# Patient Record
Sex: Female | Born: 1985 | Race: Black or African American | Hispanic: No | Marital: Single | Smoking: Never smoker
Health system: Southern US, Community
[De-identification: ages and names within clinical notes are randomized; demographics above are authoritative.]

---
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
## 2003-02-21 ENCOUNTER — Emergency Department (HOSPITAL_COMMUNITY): Admission: EM | Admit: 2003-02-21 | Discharge: 2003-02-21 | Payer: Self-pay | Admitting: *Deleted

## 2004-03-30 ENCOUNTER — Ambulatory Visit: Payer: Self-pay | Admitting: Internal Medicine

## 2004-03-31 ENCOUNTER — Ambulatory Visit: Payer: Self-pay | Admitting: *Deleted

## 2004-05-26 ENCOUNTER — Ambulatory Visit: Payer: Self-pay | Admitting: Internal Medicine

## 2004-08-26 ENCOUNTER — Encounter (INDEPENDENT_AMBULATORY_CARE_PROVIDER_SITE_OTHER): Payer: Self-pay | Admitting: Internal Medicine

## 2004-08-26 LAB — CONVERTED CEMR LAB

## 2004-09-17 ENCOUNTER — Ambulatory Visit: Payer: Self-pay | Admitting: Family Medicine

## 2005-02-23 ENCOUNTER — Ambulatory Visit: Payer: Self-pay | Admitting: Internal Medicine

## 2005-06-22 ENCOUNTER — Ambulatory Visit: Payer: Self-pay | Admitting: Internal Medicine

## 2005-12-02 ENCOUNTER — Ambulatory Visit: Payer: Self-pay | Admitting: Internal Medicine

## 2006-05-18 ENCOUNTER — Ambulatory Visit: Payer: Self-pay | Admitting: Internal Medicine

## 2006-09-13 ENCOUNTER — Encounter (INDEPENDENT_AMBULATORY_CARE_PROVIDER_SITE_OTHER): Payer: Self-pay | Admitting: Internal Medicine

## 2006-09-13 DIAGNOSIS — J45909 Unspecified asthma, uncomplicated: Secondary | ICD-10-CM | POA: Insufficient documentation

## 2006-09-13 DIAGNOSIS — H1045 Other chronic allergic conjunctivitis: Secondary | ICD-10-CM

## 2006-12-13 ENCOUNTER — Encounter (INDEPENDENT_AMBULATORY_CARE_PROVIDER_SITE_OTHER): Payer: Self-pay | Admitting: *Deleted

## 2007-01-01 ENCOUNTER — Ambulatory Visit: Payer: Self-pay | Admitting: Family Medicine

## 2007-05-10 ENCOUNTER — Encounter: Payer: Self-pay | Admitting: Internal Medicine

## 2007-05-10 ENCOUNTER — Ambulatory Visit: Payer: Self-pay | Admitting: Internal Medicine

## 2007-05-10 LAB — CONVERTED CEMR LAB
Chlamydia, DNA Probe: NEGATIVE
GC Probe Amp, Genital: NEGATIVE

## 2008-01-07 ENCOUNTER — Ambulatory Visit: Payer: Self-pay | Admitting: Internal Medicine

## 2008-05-26 ENCOUNTER — Ambulatory Visit: Payer: Self-pay | Admitting: Internal Medicine

## 2008-05-27 ENCOUNTER — Inpatient Hospital Stay (HOSPITAL_COMMUNITY): Admission: AD | Admit: 2008-05-27 | Discharge: 2008-05-27 | Payer: Self-pay | Admitting: Obstetrics & Gynecology

## 2008-06-04 ENCOUNTER — Inpatient Hospital Stay (HOSPITAL_COMMUNITY): Admission: AD | Admit: 2008-06-04 | Discharge: 2008-06-05 | Payer: Self-pay | Admitting: Obstetrics & Gynecology

## 2009-01-11 ENCOUNTER — Inpatient Hospital Stay (HOSPITAL_COMMUNITY): Admission: AD | Admit: 2009-01-11 | Discharge: 2009-01-11 | Payer: Self-pay | Admitting: Obstetrics and Gynecology

## 2009-01-19 ENCOUNTER — Emergency Department (HOSPITAL_COMMUNITY): Admission: EM | Admit: 2009-01-19 | Discharge: 2009-01-19 | Payer: Self-pay | Admitting: Emergency Medicine

## 2009-01-20 ENCOUNTER — Inpatient Hospital Stay (HOSPITAL_COMMUNITY): Admission: AD | Admit: 2009-01-20 | Discharge: 2009-01-20 | Payer: Self-pay | Admitting: Obstetrics and Gynecology

## 2009-01-26 ENCOUNTER — Inpatient Hospital Stay (HOSPITAL_COMMUNITY): Admission: AD | Admit: 2009-01-26 | Discharge: 2009-01-28 | Payer: Self-pay | Admitting: Obstetrics and Gynecology

## 2009-05-12 ENCOUNTER — Ambulatory Visit: Payer: Self-pay | Admitting: Family Medicine

## 2009-05-18 ENCOUNTER — Encounter (INDEPENDENT_AMBULATORY_CARE_PROVIDER_SITE_OTHER): Payer: Self-pay | Admitting: Family Medicine

## 2009-05-18 ENCOUNTER — Ambulatory Visit: Payer: Self-pay | Admitting: Family Medicine

## 2009-05-18 LAB — CONVERTED CEMR LAB: Preg, Serum: NEGATIVE

## 2009-05-19 ENCOUNTER — Ambulatory Visit: Payer: Self-pay | Admitting: Family Medicine

## 2009-08-11 ENCOUNTER — Ambulatory Visit: Payer: Self-pay | Admitting: Family Medicine

## 2009-10-29 ENCOUNTER — Ambulatory Visit: Payer: Self-pay | Admitting: Internal Medicine

## 2009-12-14 ENCOUNTER — Ambulatory Visit: Payer: Self-pay | Admitting: Internal Medicine

## 2010-06-30 LAB — CBC
HCT: 34.8 % — ABNORMAL LOW (ref 36.0–46.0)
Hemoglobin: 10.9 g/dL — ABNORMAL LOW (ref 12.0–15.0)
Hemoglobin: 11.5 g/dL — ABNORMAL LOW (ref 12.0–15.0)
MCHC: 33.4 g/dL (ref 30.0–36.0)
MCV: 88.9 fL (ref 78.0–100.0)
RBC: 3.67 MIL/uL — ABNORMAL LOW (ref 3.87–5.11)
RBC: 3.93 MIL/uL (ref 3.87–5.11)
WBC: 15.4 10*3/uL — ABNORMAL HIGH (ref 4.0–10.5)
WBC: 18.2 10*3/uL — ABNORMAL HIGH (ref 4.0–10.5)

## 2010-06-30 LAB — RH IMMUNE GLOB WKUP(>/=20WKS)(NOT WOMEN'S HOSP)

## 2010-07-08 LAB — ABO/RH: ABO/RH(D): A NEG

## 2010-07-08 LAB — URINALYSIS, ROUTINE W REFLEX MICROSCOPIC
Bilirubin Urine: NEGATIVE
Glucose, UA: NEGATIVE mg/dL
Ketones, ur: NEGATIVE mg/dL
Nitrite: NEGATIVE
Protein, ur: NEGATIVE mg/dL
Specific Gravity, Urine: >1.03 — ABNORMAL HIGH (ref 1.005–1.030)
Urobilinogen, UA: 0.2 mg/dL (ref 0.0–1.0)
pH: 6 (ref 5.0–8.0)

## 2010-07-08 LAB — WET PREP, GENITAL

## 2010-07-08 LAB — CBC
Hemoglobin: 11.5 g/dL — ABNORMAL LOW (ref 12.0–15.0)
Platelets: 236 10*3/uL (ref 150–400)
RDW: 13.4 % (ref 11.5–15.5)

## 2010-07-08 LAB — GC/CHLAMYDIA PROBE AMP, GENITAL: Chlamydia, DNA Probe: NEGATIVE

## 2010-07-08 LAB — RH IMMUNE GLOBULIN WORKUP (NOT WOMEN'S HOSP)

## 2010-07-08 LAB — URINE MICROSCOPIC-ADD ON

## 2011-05-19 ENCOUNTER — Other Ambulatory Visit: Payer: Self-pay | Admitting: Family Medicine

## 2011-07-29 ENCOUNTER — Emergency Department (HOSPITAL_COMMUNITY)
Admission: EM | Admit: 2011-07-29 | Discharge: 2011-07-29 | Disposition: A | Discharge status: Home / Self Care | Payer: No Typology Code available for payment source | Attending: Emergency Medicine | Admitting: Emergency Medicine

## 2011-07-29 ENCOUNTER — Encounter (HOSPITAL_COMMUNITY): Payer: Self-pay

## 2011-07-29 ENCOUNTER — Emergency Department (HOSPITAL_COMMUNITY): Payer: No Typology Code available for payment source

## 2011-07-29 DIAGNOSIS — M542 Cervicalgia: Secondary | ICD-10-CM | POA: Insufficient documentation

## 2011-07-29 DIAGNOSIS — S0990XA Unspecified injury of head, initial encounter: Secondary | ICD-10-CM | POA: Insufficient documentation

## 2011-07-29 DIAGNOSIS — S161XXA Strain of muscle, fascia and tendon at neck level, initial encounter: Secondary | ICD-10-CM

## 2011-07-29 MED ORDER — IBUPROFEN 800 MG PO TABS: 800.0000 mg | ORAL_TABLET | Freq: Three times a day (TID) | ORAL | Status: AC | PRN

## 2011-07-29 MED ORDER — HYDROCODONE-ACETAMINOPHEN 5-325 MG PO TABS: 1.0000 | ORAL_TABLET | Freq: Four times a day (QID) | ORAL | Status: AC | PRN

## 2011-07-29 MED ORDER — IBUPROFEN 800 MG PO TABS
800.0000 mg | ORAL_TABLET | Freq: Once | ORAL | Status: AC
  Administered 2011-07-29: 800 mg via ORAL
  Filled 2011-07-29: qty 1

## 2011-07-29 NOTE — Discharge Instructions (Signed)
Return here as needed. Your x-rays were normal. You will be more sore tomorrow and over the next 7-10 days.
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
## 2011-07-30 NOTE — ED Provider Notes (Signed)
Medical screening examination/treatment/procedure(s) were performed by non-physician practitioner and as supervising physician I was immediately available for consultation/collaboration.  Macklyn Glandon, MD 07/30/11 0001 

## 2011-11-09 ENCOUNTER — Emergency Department (INDEPENDENT_AMBULATORY_CARE_PROVIDER_SITE_OTHER)
Admission: EM | Admit: 2011-11-09 | Discharge: 2011-11-09 | Disposition: A | Discharge status: Home / Self Care | Payer: Self-pay | Source: Home / Self Care | Attending: Emergency Medicine | Admitting: Emergency Medicine

## 2011-11-09 ENCOUNTER — Encounter (HOSPITAL_COMMUNITY): Payer: Self-pay | Admitting: *Deleted

## 2011-11-09 DIAGNOSIS — J039 Acute tonsillitis, unspecified: Secondary | ICD-10-CM

## 2011-11-09 LAB — POCT RAPID STREP A: Streptococcus, Group A Screen (Direct): POSITIVE — AB

## 2011-11-09 MED ORDER — PREDNISONE 20 MG PO TABS
ORAL_TABLET | ORAL | Status: AC
  Filled 2011-11-09: qty 2

## 2011-11-09 MED ORDER — HYDROCODONE-ACETAMINOPHEN 7.5-500 MG/15ML PO SOLN: 15.0000 mL | Freq: Four times a day (QID) | ORAL | Status: AC | PRN

## 2011-11-09 MED ORDER — CEFTRIAXONE SODIUM 1 G IJ SOLR
INTRAMUSCULAR | Status: AC
  Filled 2011-11-09: qty 10

## 2011-11-09 MED ORDER — PREDNISONE 20 MG PO TABS
40.0000 mg | ORAL_TABLET | Freq: Once | ORAL | Status: AC
  Administered 2011-11-09: 40 mg via ORAL

## 2011-11-09 MED ORDER — CEFTRIAXONE SODIUM 1 G IJ SOLR
1.0000 g | Freq: Once | INTRAMUSCULAR | Status: AC
  Administered 2011-11-09: 1 g via INTRAMUSCULAR

## 2011-11-09 MED ORDER — AMOXICILLIN 500 MG PO CAPS: 500.0000 mg | ORAL_CAPSULE | Freq: Three times a day (TID) | ORAL | Status: AC

## 2011-11-09 NOTE — ED Notes (Signed)
Pt  Also     sorethroat  As  Well  As  Pain opn swallowing  Earache  And  Headache  Symptoms  X         Several  Days   Voice  Is  Hoarse       Airway  Is  Intact  However

## 2011-11-09 NOTE — ED Provider Notes (Signed)
History     CSN: [id number]  Arrival date & time 11/09/11  1352   First MD Initiated Contact with Patient 11/09/11 1557      Chief Complaint  Patient presents with  . Sore Throat    (Consider location/radiation/quality/duration/timing/severity/associated sxs/prior treatment) HPI Comments: Patient presents urgent care this evening complaining of sore throat and discomfort swallowing with bilateral ear pain for about 4 days. No cough, no runny nose, no shortness of breath and wheezing.  Patient is a 26 y.o. female presenting with pharyngitis. The history is provided by the patient.  Sore Throat This is a recurrent problem. The current episode started more than 2 days ago. The problem occurs constantly. The problem has not changed since onset.Pertinent negatives include no abdominal pain. The symptoms are aggravated by swallowing. She has tried nothing for the symptoms. The treatment provided no relief.    History reviewed. No pertinent past medical history.  History reviewed. No pertinent past surgical history.  No family history on file.  History  Substance Use Topics  . Smoking status: Never Smoker   . Smokeless tobacco: Never Used  . Alcohol Use: No    OB History    Grav Para Term Preterm Abortions TAB SAB Ect Mult Living                  Review of Systems  Constitutional: Positive for activity change. Negative for fever and appetite change.  HENT: Positive for sore throat. Negative for mouth sores, neck pain, neck stiffness and sinus pressure.   Gastrointestinal: Negative for abdominal pain.  Skin: Negative for rash.  Neurological: Negative for dizziness and numbness.    Allergies  Metronidazole  Home Medications   Current Outpatient Rx  Name Route Sig Dispense Refill  . AMOXICILLIN 500 MG PO CAPS Oral Take 1 capsule (500 mg total) by mouth 3 (three) times daily. 30 capsule 0  . HYDROCODONE-ACETAMINOPHEN 7.5-500 MG/15ML PO SOLN Oral Take 15 mLs by mouth  every 6 (six) hours as needed for pain. 120 mL 0    BP 128/70  Pulse 120  Temp 100.3 F (37.9 C) (Oral)  Resp 16  SpO2 100%  Physical Exam  Nursing note and vitals reviewed. Constitutional: She appears well-developed and well-nourished.  Non-toxic appearance. She does not have a sickly appearance. She does not appear ill. No distress.  HENT:  Right Ear: Tympanic membrane normal.  Left Ear: Tympanic membrane normal.  Mouth/Throat: Uvula is midline. Oropharyngeal exudate, posterior oropharyngeal edema and posterior oropharyngeal erythema present. No tonsillar abscesses.  Neck: Neck supple. No JVD present.  Cardiovascular: Normal rate.   Pulmonary/Chest: Effort normal and breath sounds normal. She has no decreased breath sounds.  Abdominal: Soft. Normal appearance. There is no tenderness. There is no rigidity.  Musculoskeletal: She exhibits no tenderness.  Lymphadenopathy:    She has cervical adenopathy.  Neurological: She is alert.  Skin: No rash noted. No erythema.    ED Course  Procedures (including critical care time)  Labs Reviewed  POCT RAPID STREP A ([facility] ONLY) - Abnormal; Notable for the following:    Streptococcus, Group A Screen (Direct) POSITIVE (*)     All other components within normal limits   No results found.   1. Tonsillitis       MDM  Uncomplicated- streptococcal, pharyngitis.        Jimmie Molly, MD 11/09/11 1946

## 2012-09-01 ENCOUNTER — Emergency Department (HOSPITAL_BASED_OUTPATIENT_CLINIC_OR_DEPARTMENT_OTHER): Payer: No Typology Code available for payment source

## 2012-09-01 ENCOUNTER — Encounter (HOSPITAL_COMMUNITY): Payer: Self-pay | Admitting: *Deleted

## 2012-09-01 ENCOUNTER — Emergency Department (HOSPITAL_BASED_OUTPATIENT_CLINIC_OR_DEPARTMENT_OTHER)
Admission: EM | Admit: 2012-09-01 | Discharge: 2012-09-02 | Disposition: A | Discharge status: Home / Self Care | Payer: Worker's Compensation | Attending: Emergency Medicine | Admitting: Emergency Medicine

## 2012-09-01 ENCOUNTER — Encounter (HOSPITAL_BASED_OUTPATIENT_CLINIC_OR_DEPARTMENT_OTHER): Payer: Self-pay | Admitting: *Deleted

## 2012-09-01 ENCOUNTER — Emergency Department (HOSPITAL_COMMUNITY)
Admission: EM | Admit: 2012-09-01 | Discharge: 2012-09-01 | Discharge status: Against Medical Advice | Payer: Worker's Compensation | Attending: Emergency Medicine | Admitting: Emergency Medicine

## 2012-09-01 DIAGNOSIS — Y990 Civilian activity done for income or pay: Secondary | ICD-10-CM | POA: Insufficient documentation

## 2012-09-01 DIAGNOSIS — S8990XA Unspecified injury of unspecified lower leg, initial encounter: Secondary | ICD-10-CM | POA: Insufficient documentation

## 2012-09-01 DIAGNOSIS — W2209XA Striking against other stationary object, initial encounter: Secondary | ICD-10-CM | POA: Insufficient documentation

## 2012-09-01 DIAGNOSIS — Y9389 Activity, other specified: Secondary | ICD-10-CM | POA: Insufficient documentation

## 2012-09-01 DIAGNOSIS — Y9289 Other specified places as the place of occurrence of the external cause: Secondary | ICD-10-CM | POA: Insufficient documentation

## 2012-09-01 DIAGNOSIS — S99929A Unspecified injury of unspecified foot, initial encounter: Secondary | ICD-10-CM | POA: Insufficient documentation

## 2012-09-01 DIAGNOSIS — S99921A Unspecified injury of right foot, initial encounter: Secondary | ICD-10-CM

## 2012-09-01 DIAGNOSIS — W230XXA Caught, crushed, jammed, or pinched between moving objects, initial encounter: Secondary | ICD-10-CM | POA: Insufficient documentation

## 2012-09-01 DIAGNOSIS — S6981XA Other specified injuries of right wrist, hand and finger(s), initial encounter: Secondary | ICD-10-CM

## 2012-09-01 NOTE — ED Notes (Signed)
Pt states she was at work earlier today and her right great toe got caught under the door. C/O pain to same. Was at [facility] earlier, but left.

## 2012-09-01 NOTE — ED Notes (Signed)
Pt was at work and the Writer on her big toe and was hung and her manager pushed door off and toe has been bleeding since that time at 1 pm today.  Right big toe is bandaged at this time pain 8/10 throb pain

## 2012-09-02 ENCOUNTER — Emergency Department (HOSPITAL_BASED_OUTPATIENT_CLINIC_OR_DEPARTMENT_OTHER): Payer: Worker's Compensation

## 2012-09-02 MED ORDER — BUPIVACAINE HCL 0.5 % IJ SOLN
50.0000 mL | Freq: Once | INTRAMUSCULAR | Status: DC
  Filled 2012-09-02: qty 1

## 2012-09-02 NOTE — ED Notes (Signed)
I wrapped toe in petrolatum gauze (non-adherent) then wrap of kerlix thickly padded, secured with plastic tape. I then fit and applied post op shoe.

## 2012-09-02 NOTE — ED Provider Notes (Signed)
History     This chart was scribed for Jones Skene, MD by Jiles Prows, ED Scribe. The patient was seen in room MH12/MH12 and the patient's care was started at 12:31 AM.  CSN: [id number]  Arrival date & time 09/01/12  2254  Chief Complaint  Patient presents with  . Toe Injury    HPI HPI Comments: Karen Lowe is a 27 y.o. female who presents to the Emergency Department complaining of sudden, moderate, constant right great toe pain after a heavy door swung into her foot and got stuck this afternoon. Pt reports that the pain is throbbing.  Pt reports her manager had to forcibly remove the door from her foot.  Her pain is worse with walking. Pt denies chest pain, rash, dysuria, headache, diaphoresis, fever, chills, nausea, vomiting, diarrhea, weakness, cough, SOB and any other pain.   Pt denies smoking.  History reviewed. No pertinent past medical history.  History reviewed. No pertinent past surgical history.  History reviewed. No pertinent family history.  History  Substance Use Topics  . Smoking status: Never Smoker   . Smokeless tobacco: Never Used  . Alcohol Use: No    OB History   Grav Para Term Preterm Abortions TAB SAB Ect Mult Living                  Review of Systems At least 10pt or greater review of systems completed and are negative except where specified in the HP.  Allergies  Metronidazole  Home Medications   Current Outpatient Rx  Name  Route  Sig  Dispense  Refill  . acetaminophen (TYLENOL) 500 MG tablet   Oral   Take 500 mg by mouth every 6 (six) hours as needed for pain.          BP 131/77  Pulse 86  Temp(Src) 98.9 F (37.2 C) (Oral)  Resp 16  Ht 5\' 3"  (1.6 m)  Wt 130 lb (58.968 kg)  BMI 23.03 kg/m2  SpO2 100%  LMP 08/06/2012  Physical Exam  Nursing notes reviewed.  Electronic medical record reviewed. VITAL SIGNS:   Filed Vitals:   09/01/12 2303  BP: 131/77  Pulse: 86  Temp: 98.9 F (37.2 C)  TempSrc: Oral  Resp: 16   Height: 5\' 3"  (1.6 m)  Weight: 130 lb (58.968 kg)  SpO2: 100%   CONSTITUTIONAL: Awake, oriented, appears non-toxic HENT: Atraumatic, normocephalic, oral mucosa pink and moist, airway patent. Nares patent without drainage. External ears normal. EYES: Conjunctiva clear, EOMI, PERRLA NECK: Trachea midline, non-tender, supple CARDIOVASCULAR: Normal heart rate, Normal rhythm, No murmurs, rubs, gallops PULMONARY/CHEST: Clear to auscultation, no rhonchi, wheezes, or rales. Symmetrical breath sounds. Non-tender. ABDOMINAL: Non-distended, soft, non-tender - no rebound or guarding.  BS normal. NEUROLOGIC: Non-focal, moving all four extremities, no gross sensory or motor deficits. EXTREMITIES: No clubbing, cyanosis, or edema. Cuticle of right great toe pushed back with some loss of the nail perhaps penetrating into the germinal matrix. Bleeding is hemostatic. The nail is stable. No lacerations evident. SKIN: Warm, Dry, No erythema, No rash  ED Course  Procedures (including critical care time) DIAGNOSTIC STUDIES: Oxygen Saturation is 100% on RA, normal by my interpretation.    COORDINATION OF CARE: 12:33 AM - Discussed ED treatment with pt at bedside including x-ray and pt agrees.   Labs Reviewed - No data to display Dg Foot Complete Right  09/02/2012   *RADIOLOGY REPORT*  Clinical Data: Foot injury and pain.  RIGHT FOOT COMPLETE - 3+ VIEW  Comparison: None  Findings: There is no evidence of acute fracture, subluxation, or dislocation. The Lisfranc joints are intact. No focal bony lesions are identified. There is no evidence of radiopaque foreign body.  The joint spaces are unremarkable.  IMPRESSION: Unremarkable exam.   Original Report Authenticated By: Harmon Pier, M.D.     1. Nail bed injury, right, initial encounter   2. Toe injury, right, initial encounter       MDM  Patient was toe injury, she would not tolerate an exam of her nail toe-performed a digital block of the toe to facilitate  an exam, I informed her that this occurred a risk of bleeding, infection or damage to adjacent structures, patient agrees to this minor procedure, after performing digital block examined the nail - nail is stable. Some of the nail has been removed, the germinal matrix appears intact. This wound will need to heal by itself, keep clean and dry, apply antibiotic ointment-return for any signs or symptoms consistent with infection. Also place patient in a postop shoe for protection.  I personally performed the services described in this documentation, which was scribed in my presence. The recorded information has been reviewed and is accurate. Jones Skene, M.D.     Jones Skene, MD 09/02/12 [phone]

## 2012-09-02 NOTE — ED Notes (Signed)
MD at bedside. 

## 2012-09-06 ENCOUNTER — Emergency Department (HOSPITAL_BASED_OUTPATIENT_CLINIC_OR_DEPARTMENT_OTHER)
Admission: EM | Admit: 2012-09-06 | Discharge: 2012-09-06 | Disposition: A | Discharge status: Home / Self Care | Payer: Worker's Compensation | Attending: Emergency Medicine | Admitting: Emergency Medicine

## 2012-09-06 ENCOUNTER — Encounter (HOSPITAL_BASED_OUTPATIENT_CLINIC_OR_DEPARTMENT_OTHER): Payer: Self-pay | Admitting: *Deleted

## 2012-09-06 DIAGNOSIS — S99922D Unspecified injury of left foot, subsequent encounter: Secondary | ICD-10-CM

## 2012-09-06 DIAGNOSIS — Y939 Activity, unspecified: Secondary | ICD-10-CM | POA: Insufficient documentation

## 2012-09-06 DIAGNOSIS — X58XXXA Exposure to other specified factors, initial encounter: Secondary | ICD-10-CM | POA: Insufficient documentation

## 2012-09-06 DIAGNOSIS — Y929 Unspecified place or not applicable: Secondary | ICD-10-CM | POA: Insufficient documentation

## 2012-09-06 DIAGNOSIS — S91109A Unspecified open wound of unspecified toe(s) without damage to nail, initial encounter: Secondary | ICD-10-CM | POA: Insufficient documentation

## 2012-09-06 NOTE — ED Notes (Signed)
Pt request work note for 2 days

## 2012-09-06 NOTE — ED Provider Notes (Signed)
History     CSN: [id number]  Arrival date & time 09/06/12  4401   First MD Initiated Contact with Patient 09/06/12 1856      Chief Complaint  Patient presents with  . Toe Injury    (Consider location/radiation/quality/duration/timing/severity/associated sxs/prior treatment) HPI  Patient is a 27 yo F presenting to the ED for re-evaluation of left great toe injury that she incurred on Saturday at which time she was evaluated in the ED. Patient state she came back to the ED because she noted some minimal yellowish-clear drainage from the wound last night. Patient has not been applying antibiotic cream as advised at previous ED visit. Denies fevers or chills or re-injury to toe.   History reviewed. No pertinent past medical history.  History reviewed. No pertinent past surgical history.  No family history on file.  History  Substance Use Topics  . Smoking status: Never Smoker   . Smokeless tobacco: Never Used  . Alcohol Use: No    OB History   Grav Para Term Preterm Abortions TAB SAB Ect Mult Living                  Review of Systems  Constitutional: Negative for fever and chills.  Skin: Positive for wound. Negative for color change, pallor and rash.    Allergies  Metronidazole  Home Medications   Current Outpatient Rx  Name  Route  Sig  Dispense  Refill  . acetaminophen (TYLENOL) 500 MG tablet   Oral   Take 500 mg by mouth every 6 (six) hours as needed for pain.           BP 126/86  Pulse 97  Temp(Src) 98.7 F (37.1 C) (Oral)  Resp 18  SpO2 100%  LMP 08/06/2012  Physical Exam  Constitutional: She is oriented to person, place, and time. She appears well-developed and well-nourished. No distress.  HENT:  Head: Normocephalic and atraumatic.  Eyes: Conjunctivae are normal.  Neck: Neck supple.  Neurological: She is alert and oriented to person, place, and time.  Skin: Skin is warm and dry. She is not diaphoretic.  Psychiatric: She has a normal mood  and affect.  EXTREMITIES: No clubbing, cyanosis, or edema. Cuticle of right great toe pushed back with some loss of the nail perhaps penetrating into the germinal matrix. No bleeding or drainage at site. The nail is stable.    ED Course  Procedures (including critical care time)  Labs Reviewed - No data to display No results found.   1. Toe injury, left, subsequent encounter       MDM  No changes to PE findings from previous visit. No drainage expressed from wound. No concern for re or worsening injury to toe. Pt requested work note and was provided with one. Patient advised again to keep area clean, dry, and apply antibiotic cream to toe to prevent infection. Patient agreeable to plan. Patient is stable at time of discharge         Jeannetta Ellis, PA-C 09/06/12 2210

## 2012-09-06 NOTE — ED Notes (Signed)
Patient was seen on Saturday for a toe injury. States that she was told if it showed any signs of infection to come back. 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

## 2012-09-06 NOTE — ED Notes (Signed)
Opened chart due to patient calling asking if she needs to come in for follow up

## 2012-09-07 NOTE — ED Provider Notes (Signed)
History/physical exam/procedure(s) were performed by non-physician practitioner and as supervising physician I was immediately available for consultation/collaboration. I have reviewed all notes and am in agreement with care and plan.   Hilario Quarry, MD 09/07/12 1539

## 2013-07-24 ENCOUNTER — Encounter (HOSPITAL_COMMUNITY): Payer: Self-pay | Admitting: Emergency Medicine

## 2013-07-24 ENCOUNTER — Emergency Department (HOSPITAL_COMMUNITY)
Admission: EM | Admit: 2013-07-24 | Discharge: 2013-07-24 | Disposition: A | Discharge status: Home / Self Care | Payer: No Typology Code available for payment source | Attending: Emergency Medicine | Admitting: Emergency Medicine

## 2013-07-24 DIAGNOSIS — R519 Headache, unspecified: Secondary | ICD-10-CM

## 2013-07-24 DIAGNOSIS — S139XXA Sprain of joints and ligaments of unspecified parts of neck, initial encounter: Secondary | ICD-10-CM | POA: Insufficient documentation

## 2013-07-24 DIAGNOSIS — Z888 Allergy status to other drugs, medicaments and biological substances status: Secondary | ICD-10-CM | POA: Insufficient documentation

## 2013-07-24 DIAGNOSIS — S161XXA Strain of muscle, fascia and tendon at neck level, initial encounter: Secondary | ICD-10-CM

## 2013-07-24 DIAGNOSIS — R51 Headache: Secondary | ICD-10-CM | POA: Insufficient documentation

## 2013-07-24 DIAGNOSIS — Y939 Activity, unspecified: Secondary | ICD-10-CM | POA: Insufficient documentation

## 2013-07-24 MED ORDER — IBUPROFEN 800 MG PO TABS: 800.0000 mg | ORAL_TABLET | Freq: Three times a day (TID) | ORAL | Status: AC

## 2013-07-24 MED ORDER — METHOCARBAMOL 500 MG PO TABS: 500.0000 mg | ORAL_TABLET | Freq: Two times a day (BID) | ORAL | Status: AC

## 2013-07-24 MED ORDER — IBUPROFEN 400 MG PO TABS
800.0000 mg | ORAL_TABLET | Freq: Once | ORAL | Status: AC
  Administered 2013-07-24: 800 mg via ORAL
  Filled 2013-07-24: qty 2
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
## 2013-07-24 NOTE — ED Provider Notes (Signed)
Medical screening examination/treatment/procedure(s) were performed by non-physician practitioner and as supervising physician I was immediately available for consultation/collaboration.   EKG Interpretation None       Ethelda ChickMartha K Linker, MD 07/24/13 [phone]

## 2013-07-24 NOTE — Discharge Instructions (Signed)
1. Medications: robaxin, naproxyn, usual home medications 2. Treatment: rest, drink plenty of fluids, gentle stretching as discussed, alternate ice and heat 3. Follow Up: Please followup with your primary doctor for discussion of your diagnoses and further evaluation after today's visit; if you do not have a primary care doctor use the resource guide provided to find one;   Back Exercises Back exercises help treat and prevent back injuries. The goal of back exercises is to increase the strength of your abdominal and back muscles and the flexibility of your back. These exercises should be started when you no longer have back pain. Back exercises include:  Pelvic Tilt. Lie on your back with your knees bent. Tilt your pelvis until the lower part of your back is against the floor. Hold this position 5 to 10 sec and repeat 5 to 10 times.  Knee to Chest. Pull first 1 knee up against your chest and hold for 20 to 30 seconds, repeat this with the other knee, and then both knees. This may be done with the other leg straight or bent, whichever feels better.  Sit-Ups or Curl-Ups. Bend your knees 90 degrees. Start with tilting your pelvis, and do a partial, slow sit-up, lifting your trunk only 30 to 45 degrees off the floor. Take at least 2 to 3 seconds for each sit-up. Do not do sit-ups with your knees out straight. If partial sit-ups are difficult, simply do the above but with only tightening your abdominal muscles and holding it as directed.  Hip-Lift. Lie on your back with your knees flexed 90 degrees. Push down with your feet and shoulders as you raise your hips a couple inches off the floor; hold for 10 seconds, repeat 5 to 10 times.  Back arches. Lie on your stomach, propping yourself up on bent elbows. Slowly press on your hands, causing an arch in your low back. Repeat 3 to 5 times. Any initial stiffness and discomfort should lessen with repetition over time.  Shoulder-Lifts. Lie face down with arms  beside your body. Keep hips and torso pressed to floor as you slowly lift your head and shoulders off the floor. Do not overdo your exercises, especially in the beginning. Exercises may cause you some mild back discomfort which lasts for a few minutes; however, if the pain is more severe, or lasts for more than 15 minutes, do not continue exercises until you see your caregiver. Improvement with exercise therapy for back problems is slow.  See your caregivers for assistance with developing a proper back exercise program. Document Released: 04/21/2004 Document Revised: 06/06/2011 Document Reviewed: 01/13/2011 [facility] Patient Information 2014 [place], [place].   Cervical Sprain A cervical sprain is an injury in the neck in which the strong, fibrous tissues (ligaments) that connect your neck bones stretch or tear. Cervical sprains can range from mild to severe. Severe cervical sprains can cause the neck vertebrae to be unstable. This can lead to damage of the spinal cord and can result in serious nervous system problems. The amount of time it takes for a cervical sprain to get better depends on the cause and extent of the injury. Most cervical sprains heal in 1 to 3 weeks. CAUSES  Severe cervical sprains may be caused by:   Contact sport injuries (such as from football, rugby, wrestling, hockey, auto racing, gymnastics, diving, martial arts, or boxing).   Motor vehicle collisions.   Whiplash injuries. This is an injury from a sudden forward-and backward whipping movement of the head and  neck.  Falls.  Mild cervical sprains may be caused by:   Being in an awkward position, such as while cradling a telephone between your ear and shoulder.   Sitting in a chair that does not offer proper support.   Working at a poorly Marketing executive station.   Looking up or down for long periods of time.  SYMPTOMS   Pain, soreness, stiffness, or a burning sensation in the front, back, or sides of the  neck. This discomfort may develop immediately after the injury or slowly, 24 hours or more after the injury.   Pain or tenderness directly in the middle of the back of the neck.   Shoulder or upper back pain.   Limited ability to move the neck.   Headache.   Dizziness.   Weakness, numbness, or tingling in the hands or arms.   Muscle spasms.   Difficulty swallowing or chewing.   Tenderness and swelling of the neck.  DIAGNOSIS  Most of the time your health care provider can diagnose a cervical sprain by taking your history and doing a physical exam. Your health care provider will ask about previous neck injuries and any known neck problems, such as arthritis in the neck. X-rays may be taken to find out if there are any other problems, such as with the bones of the neck. Other tests, such as a CT scan or MRI, may also be needed.  TREATMENT  Treatment depends on the severity of the cervical sprain. Mild sprains can be treated with rest, keeping the neck in place (immobilization), and pain medicines. Severe cervical sprains are immediately immobilized. Further treatment is done to help with pain, muscle spasms, and other symptoms and may include:  Medicines, such as pain relievers, numbing medicines, or muscle relaxants.   Physical therapy. This may involve stretching exercises, strengthening exercises, and posture training. Exercises and improved posture can help stabilize the neck, strengthen muscles, and help stop symptoms from returning.  HOME CARE INSTRUCTIONS   Put ice on the injured area.   Put ice in a plastic bag.   Place a towel between your skin and the bag.   Leave the ice on for 15 20 minutes, 3 4 times a day.   If your injury was severe, you may have been given a cervical collar to wear. A cervical collar is a two-piece collar designed to keep your neck from moving while it heals.  Do not remove the collar unless instructed by your health care  provider.  If you have long hair, keep it outside of the collar.  Ask your health care provider before making any adjustments to your collar. Minor adjustments may be required over time to improve comfort and reduce pressure on your chin or on the back of your head.  Ifyou are allowed to remove the collar for cleaning or bathing, follow your health care provider's instructions on how to do so safely.  Keep your collar clean by wiping it with mild soap and water and drying it completely. If the collar you have been given includes removable pads, remove them every 1 2 days and hand wash them with soap and water. Allow them to air dry. They should be completely dry before you wear them in the collar.  If you are allowed to remove the collar for cleaning and bathing, wash and dry the skin of your neck. Check your skin for irritation or sores. If you see any, tell your health care provider.  Do not  drive while wearing the collar.   Only take over-the-counter or prescription medicines for pain, discomfort, or fever as directed by your health care provider.   Keep all follow-up appointments as directed by your health care provider.   Keep all physical therapy appointments as directed by your health care provider.   Make any needed adjustments to your workstation to promote good posture.   Avoid positions and activities that make your symptoms worse.   Warm up and stretch before being active to help prevent problems.  SEEK MEDICAL CARE IF:   Your pain is not controlled with medicine.   You are unable to decrease your pain medicine over time as planned.   Your activity level is not improving as expected.  SEEK IMMEDIATE MEDICAL CARE IF:   You develop any bleeding.  You develop stomach upset.  You have signs of an allergic reaction to your medicine.   Your symptoms get worse.   You develop new, unexplained symptoms.   You have numbness, tingling, weakness, or paralysis  in any part of your body.  MAKE SURE YOU:   Understand these instructions.  Will watch your condition.  Will get help right away if you are not doing well or get worse. Document Released: 01/09/2007 Document Revised: 01/02/2013 Document Reviewed: 09/19/2012 [facility] Patient Information 2014 [place], [place].    Emergency Department Resource Guide 1) Find a Doctor and Pay Out of Pocket Although you won't have to find out who is covered by your insurance plan, it is a good idea to ask around and get recommendations. You will then need to call the office and see if the doctor you have chosen will accept you as a new patient and what types of options they offer for patients who are self-pay. Some doctors offer discounts or will set up payment plans for their patients who do not have insurance, but you will need to ask so you aren't surprised when you get to your appointment.  2) Contact Your Local Health Department Not all health departments have doctors that can see patients for sick visits, but many do, so it is worth a call to see if yours does. If you don't know where your local health department is, you can check in your phone book. The CDC also has a tool to help you locate your state's health department, and many state websites also have listings of all of their local health departments.  3) Find a Walk-in Clinic If your illness is not likely to be very severe or complicated, you may want to try a walk in clinic. These are popping up all over the country in pharmacies, drugstores, and shopping centers. They're usually staffed by nurse practitioners or physician assistants that have been trained to treat common illnesses and complaints. They're usually fairly quick and inexpensive. However, if you have serious medical issues or chronic medical problems, these are probably not your best option.  No Primary Care Doctor: - Call Health Connect at  [phone] - they can help you locate a primary  care doctor that  accepts your insurance, provides certain services, etc. - Physician Referral Service- [phone]  Chronic Pain Problems: Organization         Address  Phone   Notes  [facility]  [phone] Patients need to be referred by their primary care doctor.   Medication Assistance: Organization         Address  Phone   Notes  [facility]  [facility] [address] [place], [place] [address] [phone] --Must be a resident of [place] -- Must have NO insurance coverage whatsoever (no Medicaid/ Medicare, etc.) -- The pt. MUST have a primary care doctor that directs their care regularly and follows them in the community   [facility]ist  [phone]   [employer]  [phone]) [phone]    Agencies that provide inexpensive medical care: Organization         Address  Phone   Notes  [facility]  [phone]   [facility]    [phone]   [facility] [address] [place], [place] [address] [phone]   [facility] [address], [place] [phone]   [facility]anned Parenthood    [phone](336) [phone]   [facility]    [phone]   [facility]  [address], [place] Phone:  [phone], Fax:  [phone] Hours of Operation:  9 am - 6 pm, M-F.  Also accepts Medicaid/Medicare and self-pay.  [facility]  [address], [place] Phone: [phone]) [phone], Fax: [phone]. Hours of Operation:  8:30 am - 5:30 pm, M-F.  Also accepts Medicaid and self-pay.  [facility] [place] [address], [place] Phone: [phone]) [phone]   [facility] [address], [place], [place] [phone], Ext. [phone] Mondays & Thursdays: 7-9 AM.  First 15 patients are seen on a first come, first serve basis.    Medicaid-accepting [facility]  Providers:  Organization         Address  Phone   Notes  [facility] [address], [place] [phone] Also accepts self-pay patients.  [facility] [address], [place]  [phone]   [facility] [address], [place] [phone]   [facility] [address] [place] [address], [place] [phone]) [phone]   Renaye RakersVeita Bland [address], [place]   [phone] Only accepts [place] Access [place] patients after they have their name applied to their card.   Self-Pay (no insurance) in [facility] [place]:  Organization         Address  Phone   Notes  Sickle Cell Patients, [facility] [address], [place] [phone]) [phone]   [facility] [address], [place] [phone]) [phone]   [facility] [place]  [address], [place] [phone]   [facility]/Dr. Osei-Bonsu  [address] [place] [address], [place] or [phone], [place] [phone] Phone number for both [place] and [place] locations is the same.  [facility] [address], [place] [phone]   [facility]  [address] [place] [address], [place] or [address] [phone]) [phone]   [facility] [address], [place] [phone], phone; [phone], fax Sees patients 1st and 3rd Saturday of every month.  Must not qualify for public or private insurance (i.e. Medicaid, Medicare, [place] Health Choice, Veterans' Benefits)  Household income should be no more than 200% of the poverty level The clinic cannot treat you if you are pregnant or think you are pregnant  Sexually transmitted diseases are not treated at the clinic.  Dental Care: Organization         Address  Phone  Notes  [facility] [address], [place] [phone]) [phone] Accepts children up to age 30 who are enrolled in [place] or [place] Health Choice; pregnant women with a Medicaid card; and children who have applied for Medicaid or [place] Health Choice, but were declined, whose parents can pay a reduced fee at time of service.  [facility] [place]  [address], [place] [phone] Accepts children up to age 30 who are enrolled in [place] or [place] Health Choice; pregnant women with a Medicaid card; and children who have applied for Medicaid or [place] Health Choice, but were declined, whose parents can pay a reduced fee at time of service.  [facility]  [address], [place] [phone]) [phone] Patients are seen by appointment only. Walk-ins are not accepted. [facility] will see patients 30 years of age and older. Monday - Tuesday (8am-5pm) Most Wednesdays (8:30-5pm) $30 per visit, cash only  [facility]  [address], [facility] [phone] Patients are seen by appointment only. Walk-ins are not accepted. [facility] will see patients 30 years of age and older. One Wednesday Evening (Monthly: Volunteer Based).  $30 per visit, cash only  [employer] [facility]  [phone] for adults; Children under age 574, call Graduate Pediatric Dentistry at [phone]) [phone]. Children aged 184-14, please call [phone] to request a pediatric application.  Dental services are provided in all areas of dental care including fillings, crowns and bridges, complete and partial dentures, implants, gum treatment, root canals, and extractions. Preventive care is also provided. Treatment is provided to both adults and children. Patients are selected via a lottery and there is often a waiting list.   [facility] [address], [place]  [phone] [email or web address]   [facility]  [address], [place], [place] [phone], Ext. [phone] Second and Fourth Thursday of each month, opens at 6:30 AM; Clinic ends at 9 AM.  Patients are seen on a first-come first-served basis, and a limited number are seen during each clinic.   [facility]  [address], [place], [place] [phone]) [phone]   Eligibility Requirements You must have lived in [place], [place], or [place] counties for at least the last three months.   You cannot be eligible for state or federal sponsored [place] Cityhealthcare insurance, including CIGNAVeterans Administration, [place]icaid, or [employer] EntertainmentMedicare.   You generally cannot be eligible for healthcare insurance through your employer.    How to apply: Eligibility screenings are held every Tuesday and Wednesday afternoon from 1:00 pm until 4:00 pm. You do not need an appointment for the interview!  [facility] [address], [place], [place] [phone]   [facility]  [phone]   [facility]  [phone]   [facility]  [phone]    Behavioral Health Resources in the Community: Intensive Outpatient Programs Organization         Address  Phone  Notes  [facility] [address], [place], [place] [phone]   [facility] [address], [place], [place] [phone]   [facility] [address], [place], [place]  [phone]   [facility] [address],  [place], [place] 1-800-853-5163 or 336-641-4981   °Substance Abuse Resources °Organization         Address  Phone  Notes  °[facility]  336-882-2125   °[facility]  336-784-9470   °[facility]  336-285-9073   °[facility]  336-845-3988   °Residential & Outpatient Substance Abuse Program  1-800-659-3381   °Psychological Services °Organization         Address  Phone  Notes  °[facility]  336- 832-9600    °[facility]  336- 378-7881   °[facility] [address], [place] 1-800-853-5163 or 336-641-4981   ° °Mobile Crisis Teams °Organization         Address  Phone  Notes  °[facility], Mobile Crisis Care Unit  1-877-626-1772   °Assertive °Psychotherapeutic Services ° [address] [place], [place] 336-834-9664   °Sharon DeEsch [address] °[place] 336-554-5454   ° °Self-Help/Support Groups °Organization         Address  Phone             Notes  °[facility] [place] - variety of support groups  336- 373-1402 Call for more information  °Narcotics Anonymous (NA), [facility] [address], °[place]  2 meetings at this location  ° °Residential Treatment Programs °Organization         Address  Phone  Notes  °[facility] [address],    °[place]  1-866-801-8205   °[facility] ° [address], [place], [place] 704-293-8524   °[facility] [address], [place] 336-845-3988 Admissions: 8am-3pm M-F  °[facility] [address]    °[place], [place] 336-841-1104   °[facility] [address], [place], [place] 336-379-7146   °[facility] [address]  °[place], [place] 336-285-9073   °[facility] - Intensive Outpatient [address], [place], [place] 336-852-3033   °[facility] ([facility] [address]  °[place], [place] 1-877-615-2722 or 336-784-9470   °[facility] ([facility]) [address] [place], [place] 336-227-7417 Accepts Medicaid  °[facility] [address]  °[place] 1-800-659-3381 Substance Abuse/Addiction Treatment  ° °[place] Behavioral Health Resources °Organization         Address  Phone  Notes  °[facility]  (888) 581-9988   °Julie Brannon, PhD [address] [place], [place]   (336) 349-5553 or (336) 951-0000    °[facility]   [address] °[place], [place] (336) 349-4454   °[facility] [address], [place], [place] (336) 342-8316 Insurance/Medicaid/sponsorship through [facility]  °[facility] [address]                                    [place], [place] (336) 342-8316 Therapy/tele-psych/case  °[facility] [address]  ° [place], [place] (336) 349-2233    °Dr. Arfeen  (336) 349-4544   °[facility] [place]  [employer] [place] [facility] 1) [address], [place] °2) [address], [place] °3)  [address], [place] (336) 349-3220 °(336) 342-7768 ° °(336) 342-8140   °[place] Child Abuse Hotline (336) 342-1394 or (336) 342-3537 (After Hours)    ° ° ° °
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
## 2014-10-05 IMAGING — CR DG FOOT COMPLETE 3+V*R*
3 series · 3 of 3 positions shown · non-contrast
Comparison: None

CLINICAL DATA: Foot injury and pain.

RIGHT FOOT COMPLETE - 3+ VIEW

[t foot ap right]
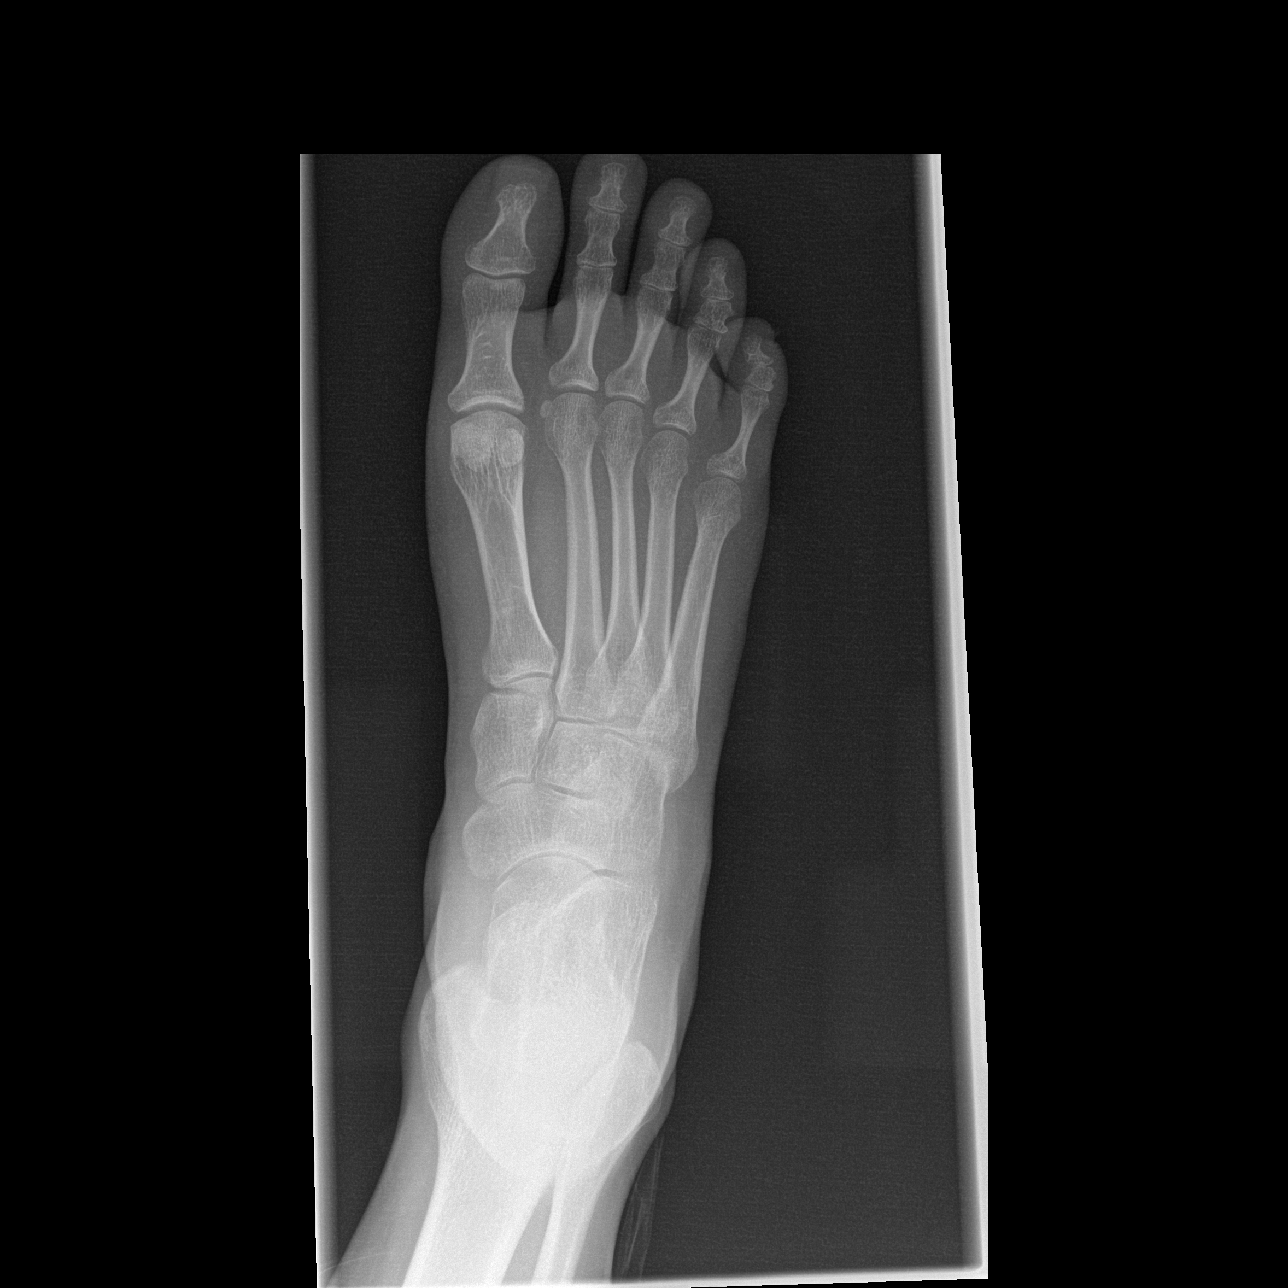

[t foot oblique right]
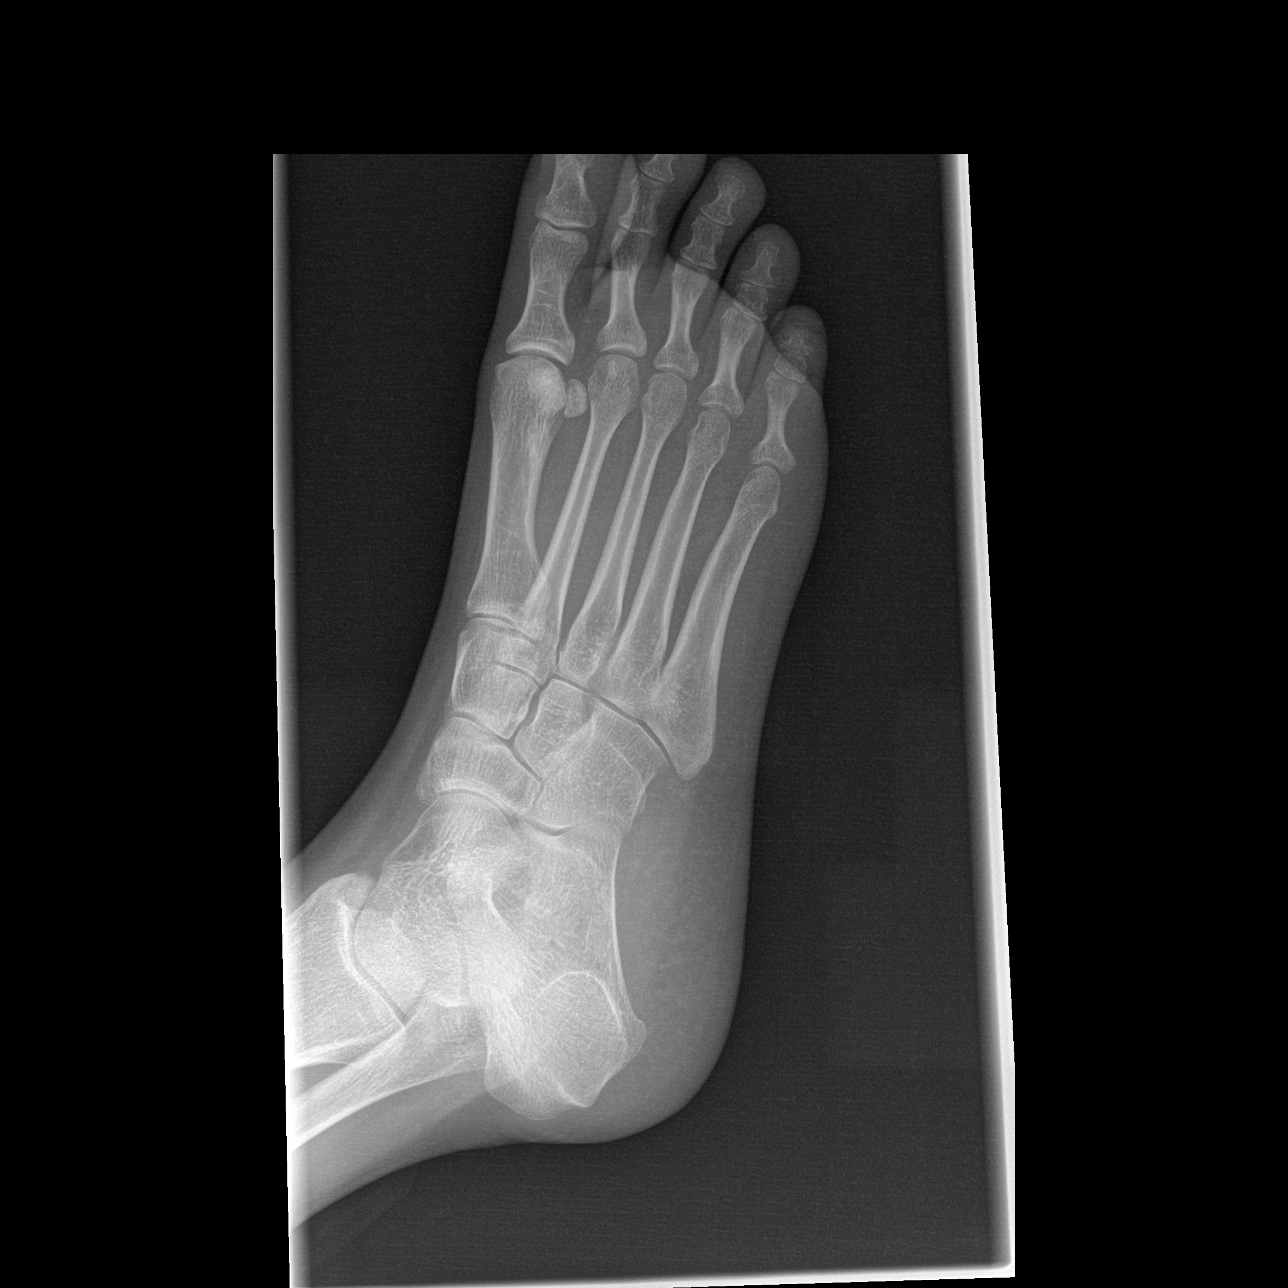

[t foot lat right]
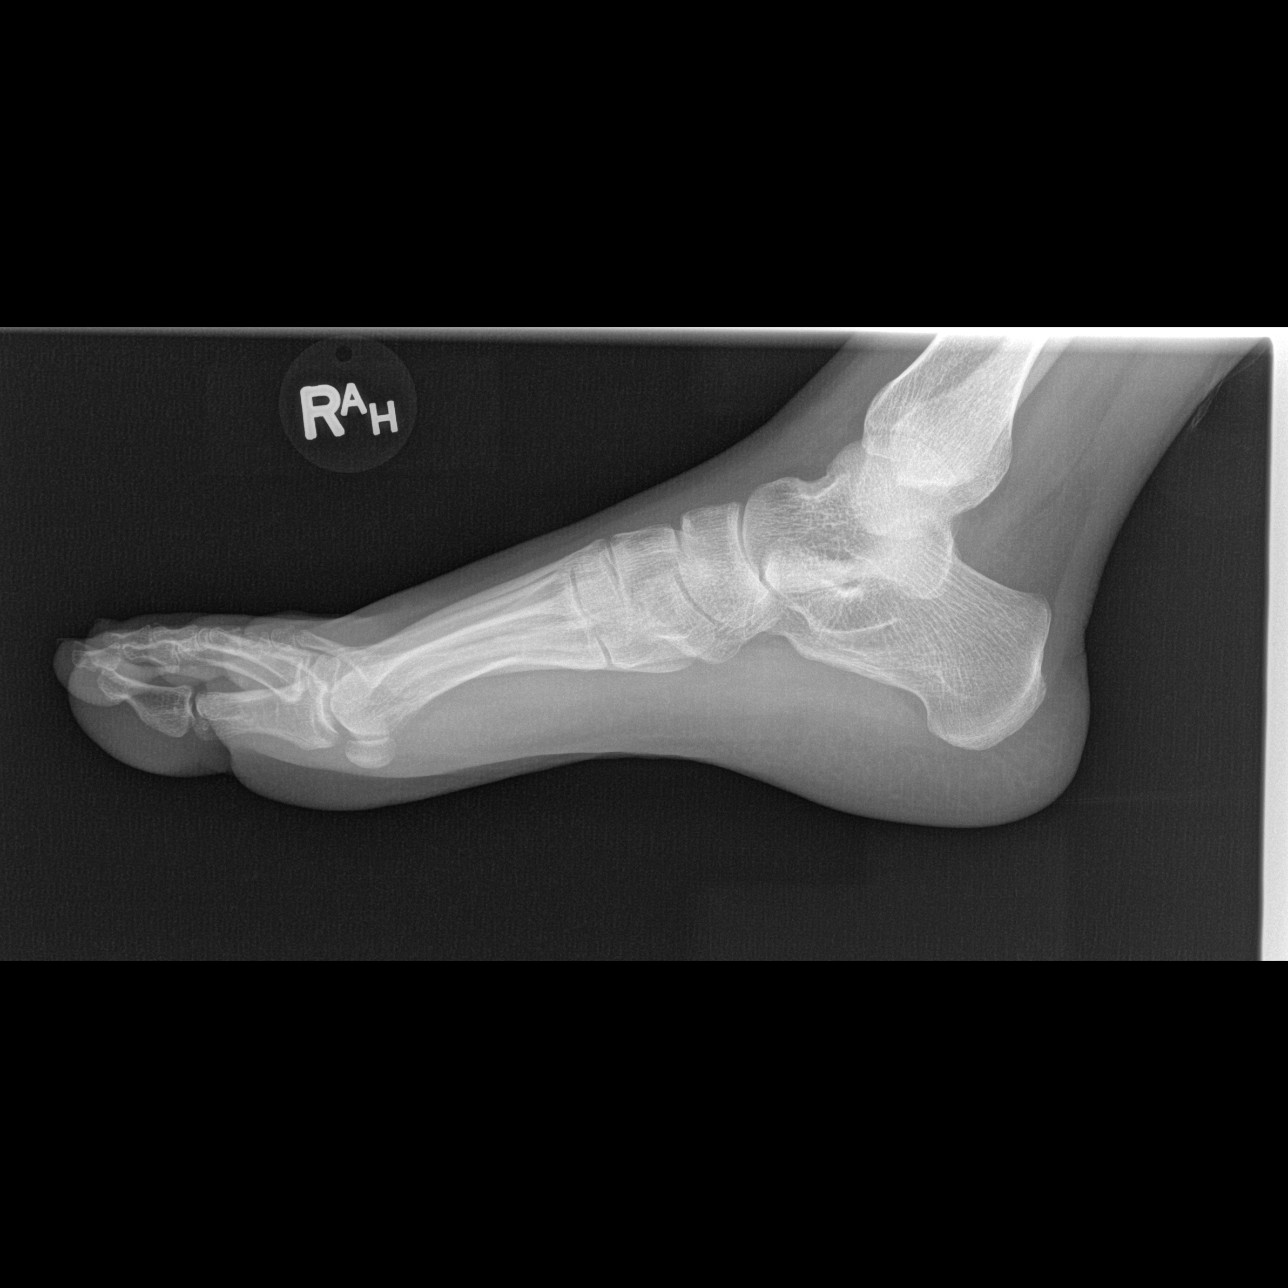

[3 of 3 positions shown; findings below may reference images not displayed]

FINDINGS: There is no evidence of acute fracture, subluxation, or
dislocation.
The Lisfranc joints are intact.
No focal bony lesions are identified.
There is no evidence of radiopaque foreign body.

The joint spaces are unremarkable.
IMPRESSION: Unremarkable exam.
